# Patient Record
Sex: Male | Born: 1945 | Race: White | Hispanic: No | Marital: Married | Smoking: Never smoker
Health system: Southern US, Community
[De-identification: ages and names within clinical notes are randomized; demographics above are authoritative.]

## PROBLEM LIST (undated history)

## (undated) DIAGNOSIS — E7800 Pure hypercholesterolemia, unspecified: Secondary | ICD-10-CM

## (undated) DIAGNOSIS — F4310 Post-traumatic stress disorder, unspecified: Secondary | ICD-10-CM

## (undated) DIAGNOSIS — I10 Essential (primary) hypertension: Secondary | ICD-10-CM

## (undated) HISTORY — PX: EYE SURGERY: SHX253

## (undated) HISTORY — PX: CARDIAC CATHETERIZATION: SHX172

---

## 2004-07-21 ENCOUNTER — Emergency Department (HOSPITAL_COMMUNITY): Admission: EM | Admit: 2004-07-21 | Discharge: 2004-07-21 | Payer: Self-pay | Admitting: Emergency Medicine

## 2008-03-23 ENCOUNTER — Emergency Department (HOSPITAL_COMMUNITY): Admission: EM | Admit: 2008-03-23 | Discharge: 2008-03-23 | Payer: Self-pay | Admitting: Emergency Medicine

## 2011-12-12 ENCOUNTER — Encounter (HOSPITAL_COMMUNITY): Payer: Self-pay | Admitting: *Deleted

## 2011-12-12 ENCOUNTER — Emergency Department (HOSPITAL_COMMUNITY)
Admission: EM | Admit: 2011-12-12 | Discharge: 2011-12-12 | Disposition: A | Discharge status: Home / Self Care | Payer: Non-veteran care | Attending: Emergency Medicine | Admitting: Emergency Medicine

## 2011-12-12 ENCOUNTER — Emergency Department (HOSPITAL_COMMUNITY): Payer: Non-veteran care

## 2011-12-12 DIAGNOSIS — S61219A Laceration without foreign body of unspecified finger without damage to nail, initial encounter: Secondary | ICD-10-CM

## 2011-12-12 DIAGNOSIS — E7800 Pure hypercholesterolemia, unspecified: Secondary | ICD-10-CM | POA: Insufficient documentation

## 2011-12-12 DIAGNOSIS — W3189XA Contact with other specified machinery, initial encounter: Secondary | ICD-10-CM | POA: Insufficient documentation

## 2011-12-12 DIAGNOSIS — I10 Essential (primary) hypertension: Secondary | ICD-10-CM | POA: Insufficient documentation

## 2011-12-12 DIAGNOSIS — S61209A Unspecified open wound of unspecified finger without damage to nail, initial encounter: Secondary | ICD-10-CM | POA: Insufficient documentation

## 2011-12-12 HISTORY — DX: Essential (primary) hypertension: I10

## 2011-12-12 HISTORY — DX: Pure hypercholesterolemia, unspecified: E78.00

## 2011-12-12 MED ORDER — BUPIVACAINE HCL (PF) 0.25 % IJ SOLN
INTRAMUSCULAR | Status: AC
  Filled 2011-12-12: qty 30

## 2011-12-12 MED ORDER — HYDROCODONE-ACETAMINOPHEN 5-325 MG PO TABS: 1.0000 | ORAL_TABLET | ORAL | Status: AC | PRN

## 2011-12-12 MED ORDER — AMOXICILLIN-POT CLAVULANATE 875-125 MG PO TABS: 1.0000 | ORAL_TABLET | Freq: Two times a day (BID) | ORAL | Status: AC

## 2011-12-12 MED ORDER — LIDOCAINE HCL (PF) 1 % IJ SOLN
INTRAMUSCULAR | Status: AC
  Filled 2011-12-12: qty 5

## 2011-12-12 MED ORDER — HYDROCODONE-ACETAMINOPHEN 5-325 MG PO TABS
2.0000 | ORAL_TABLET | Freq: Once | ORAL | Status: AC
  Administered 2011-12-12: 2 via ORAL
  Filled 2011-12-12: qty 2

## 2011-12-12 NOTE — ED Notes (Signed)
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  Injured on a grinder

## 2011-12-12 NOTE — Discharge Instructions (Signed)
The cut to your finger involves both ligament and tendon. You will need surgical repair to regain full use of the finger. Contact either of the hand surgeon's listed on your paperwork for an appointment. They are both in [place]. Take all of your antibiotics. Use the pain medicine as needed.The stitches in your finger will need to be removed in 7-10 days. You may return to the ER for removal.

## 2011-12-12 NOTE — ED Notes (Signed)
Al Decant, PA at bedside to suture laceration. Suture cart and suture kit at bedside.

## 2011-12-12 NOTE — ED Notes (Signed)
Patient out of room and waved to nurses stating "I'm leaving, I have been here an hour and no one has done a thing for me." X ray tech at bedside to take patient to imaging. RN explained to patient that he was waiting for an x ray and the MD to see him. Patient voluntarily went to x ray.

## 2011-12-12 NOTE — ED Provider Notes (Addendum)
History  .Scribed for [employer]. Colon Branch, MD, the patient was seen in [facility]. The chart was scribed by Gilman Schmidt. The patients care was started at 3:25 PM.    CSN: [id number]  Arrival date & time 12/12/11  1406   First MD Initiated Contact with Patient 12/12/11 1507      Chief Complaint  Patient presents with  . Laceration    (Consider location/radiation/quality/duration/timing/severity/associated sxs/prior treatment) HPI Adam Gay is a 66 y.o. male with a history of HTN and Hypercholesteremia who presents to the Emergency Department complaining of laceration to fifth finger on left hand. Pt reports he was using a hand grounder without a glove. Last Tetanus shot was five months prior. There are no other associated symptoms and no other alleviating or aggravating factors.       Past Medical History  Diagnosis Date  . Hypertension   . Hypercholesteremia     Past Surgical History  Procedure Date  . Eye surgery     No family history on file.  History  Substance Use Topics  . Smoking status: Never Smoker   . Smokeless tobacco: Not on file  . Alcohol Use: Yes      Review of Systems  Skin: Positive for wound.  All other systems reviewed and are negative.    Allergies  Benadryl  Home Medications  No current outpatient prescriptions on file.  BP 138/83  Pulse 82  Temp(Src) 98.2 F (36.8 C) (Oral)  Resp 20  Ht 6\' 2"  (1.88 m)  Wt 255 lb (115.667 kg)  BMI 32.74 kg/m2  Physical Exam  Constitutional: He is oriented to person, place, and time. He appears well-developed and well-nourished.  Non-toxic appearance. He does not have a sickly appearance.  HENT:  Head: Normocephalic and atraumatic.  Eyes: Conjunctivae, EOM and lids are normal. Pupils are equal, round, and reactive to light.  Neck: Trachea normal, normal range of motion and full passive range of motion without pain. Neck supple.  Cardiovascular: Regular rhythm and normal heart sounds.     Pulmonary/Chest: Effort normal and breath sounds normal. No respiratory distress.  Abdominal: Soft. Normal appearance. He exhibits no distension. There is no tenderness. There is no rebound and no CVA tenderness.  Musculoskeletal: He exhibits tenderness.       Left hand: He exhibits tenderness and laceration.       Hands:      Pt has no extension capability in the distal phaylynx.  He also cut the medial collateral ligament and there is joint laxity and instability.  Neurological: He is alert and oriented to person, place, and time. He has normal strength.  Skin: Skin is warm, dry and intact. No rash noted.          Complete circumferetonal lac at distal joint space on fifth finger    ED Course  LACERATION REPAIR Date/Time: 12/12/2011 4:00 PM Performed by: Worthy Rancher Authorized by: Worthy Rancher Consent: Verbal consent obtained. Written consent not obtained. Risks and benefits: risks, benefits and alternatives were discussed Consent given by: patient Patient understanding: patient states understanding of the procedure being performed Site marked: the operative site was not marked Imaging studies: imaging studies available Required items: required blood products, implants, devices, and special equipment available Patient identity confirmed: verbally with patient Time out: Immediately prior to procedure a "time out" was called to verify the correct patient, procedure, equipment, support staff and site/side marked as required. Laceration length: 3 cm Foreign bodies: no foreign bodies  Tendon involvement: complex (extensor function non-functional.  also, medial collateral ligament appears to have been lacerated.  there is laxity and instability to lateral movement.) Nerve involvement: none Vascular damage: no Local anesthetic: bupivacaine 0.5% without epinephrine Anesthetic total: 8 ml Patient sedated: no Preparation: Patient was prepped and draped in the usual sterile  fashion. Irrigation solution: saline Irrigation method: syringe Amount of cleaning: extensive Debridement: none Degree of undermining: none Skin closure: 4-0 nylon Number of sutures: 8 Approximation: close Approximation difficulty: simple Dressing: antibiotic ointment Patient tolerance: Patient tolerated the procedure well with no immediate complications.   (including critical care time) .   Labs Reviewed - No data to display Dg Finger Little Left  12/12/2011  *RADIOLOGY REPORT*  Clinical Data: Laceration.  LEFT LITTLE FINGER 2+V  Comparison: None.  Findings: Soft tissue injury left fifth finger distal interphalangeal joint space.  There may be a tiny fracture at this level.  Soft tissue (tendon and / capsule) injury may be present as the left fifth finger is held in flexion at the distal interphalangeal joint space level.  Tiny radiopaque structure adjacent to the left fifth proximal phalanx distal aspect.  IMPRESSION: Soft tissue injury left fifth finger distal interphalangeal joint space.  There may be a tiny fracture at this level.  Soft tissue (tendon and / capsule) injury may be present as the left fifth finger is held in flexion at the distal interphalangeal joint space level.  Tiny radiopaque structure adjacent to the left fifth proximal phalanx distal aspect.  Original Report Authenticated By: Fuller Canada, M.D.     No diagnosis found.  DIAGNOSTIC STUDIES: Radiology: DG Finger Little Left. Reviewed by me. IMPRESSION: Soft tissue injury left fifth finger distal interphalangeal joint space. There may be a tiny fracture at this level. Soft tissue (tendon and / capsule) injury may be present as the left fifth finger is held in flexion at the distal interphalangeal joint space level. Tiny radiopaque structure adjacent to the left fifth proximal phalanx distal aspect. Original Report Authenticated By: Fuller Canada, M.D.   COORDINATION OF CARE: 3:25pm:  - Patient  evaluated by ED physician, DG Finger Left, Marcaine,and  Xylocaine ordered. Digital Block performed.       MDM   Patient with injury to 5th finger on left hand with a grinder. Laceration severed extensor tendon and medial ligament. Laceration was repaired by mid level under my supervision. Finger placed in a splint. Referrals to hand surgeon given to the patient. Rx for antibiotics and analgesics given.Pt stable in ED with no significant deterioration in condition.The patient appears reasonably screened and/or stabilized for discharge and I doubt any other medical condition or other [facility] requiring further screening, evaluation, or treatment in the ED at this time prior to discharge.  I personally performed the services described in this documentation, which was scribed in my presence. The recorded information has been reviewed and considered.  MDM Reviewed: nursing note and vitals Interpretation: x-ray        Nicoletta Dress. Colon Branch, MD 12/12/11 1703  Nicoletta Dress. Colon Branch, MD 12/12/11 1705

## 2020-03-13 ENCOUNTER — Encounter (HOSPITAL_COMMUNITY): Payer: Self-pay

## 2020-03-13 ENCOUNTER — Emergency Department (HOSPITAL_COMMUNITY)
Admission: EM | Admit: 2020-03-13 | Discharge: 2020-03-13 | Disposition: A | Discharge status: Home / Self Care | Payer: No Typology Code available for payment source | Attending: Emergency Medicine | Admitting: Emergency Medicine

## 2020-03-13 ENCOUNTER — Emergency Department (HOSPITAL_COMMUNITY): Payer: No Typology Code available for payment source

## 2020-03-13 ENCOUNTER — Other Ambulatory Visit: Payer: Self-pay

## 2020-03-13 DIAGNOSIS — M7662 Achilles tendinitis, left leg: Secondary | ICD-10-CM

## 2020-03-13 DIAGNOSIS — M7582 Other shoulder lesions, left shoulder: Secondary | ICD-10-CM | POA: Diagnosis not present

## 2020-03-13 DIAGNOSIS — M25512 Pain in left shoulder: Secondary | ICD-10-CM | POA: Diagnosis present

## 2020-03-13 DIAGNOSIS — I10 Essential (primary) hypertension: Secondary | ICD-10-CM | POA: Diagnosis not present

## 2020-03-13 MED ORDER — PRASUGREL HCL 10 MG PO TABS: 10.0000 mg | ORAL_TABLET | Freq: Once | ORAL | Status: DC

## 2020-03-13 MED ORDER — PREDNISONE 10 MG PO TABS: ORAL_TABLET | ORAL | 0 refills | Status: AC

## 2020-03-13 MED ORDER — HYDROCODONE-ACETAMINOPHEN 5-325 MG PO TABS: 1.0000 | ORAL_TABLET | ORAL | 0 refills | Status: AC | PRN

## 2020-03-13 NOTE — ED Notes (Signed)
ED Provider at bedside. 

## 2020-03-13 NOTE — Discharge Instructions (Signed)
Return if any problems.

## 2020-03-13 NOTE — ED Provider Notes (Signed)
  Face-to-face evaluation   History: He presents for evaluation of intermittent pain left shoulder, worsening.  Also has soreness of left ankle.  Physical exam: Alert, elderly male, who is calm and comfortable.  Left shoulder has good range of motion but gives way on testing of strength the rotator cuff.  Left calcaneus is swollen and tender posteriorly, and appears to have a bursitis.  Medical screening examination/treatment/procedure(s) were conducted as a shared visit with non-physician practitioner(s) and myself.  I personally evaluated the patient during the encounter    Mancel Bale, MD 03/14/20 2318

## 2020-03-13 NOTE — ED Provider Notes (Signed)
[facility] EMERGENCY DEPARTMENT Provider Note   CSN: [id number] Arrival date & time: 03/13/20  1029     History Chief Complaint  Patient presents with  . Shoulder Pain    Adam Gay is a 74 y.o. male.  The history is provided by the patient. No language interpreter was used.  Shoulder Pain Location:  Shoulder Shoulder location:  L shoulder Injury: no   Pain details:    Quality:  Aching   Radiates to:  L shoulder   Severity:  Moderate   Onset quality:  Gradual   Timing:  Constant   Progression:  Worsening Dislocation: no   Prior injury to area:  No Relieved by:  Nothing Worsened by:  Nothing Ineffective treatments:  None tried Associated symptoms: no swelling   Risk factors: no concern for non-accidental trauma   Pt reports pain with using left shoulder.  Pain with trying to lift .  Pt also has a knot to the back of his left ankle that is painful      Past Medical History:  Diagnosis Date  . Hypercholesteremia   . Hypertension     There are no problems to display for this patient.   Past Surgical History:  Procedure Laterality Date  . CARDIAC CATHETERIZATION    . EYE SURGERY         No family history on file.  Social History   Tobacco Use  . Smoking status: Never Smoker  . Smokeless tobacco: Never Used  Substance Use Topics  . Alcohol use: Yes    Comment: occ  . Drug use: No    Home Medications Prior to Admission medications   Medication Sig Start Date End Date Taking? Authorizing Provider  HYDROcodone-acetaminophen (NORCO/VICODIN) 5-325 MG tablet Take 1 tablet by mouth every 4 (four) hours as needed. 03/13/20   Elson Areas, PA-C  predniSONE (DELTASONE) 10 MG tablet 6,5,4,3,2,1 taper 03/13/20   Elson Areas, PA-C    Allergies    Benadryl [diphenhydramine hcl]  Review of Systems   Review of Systems  Musculoskeletal: Positive for arthralgias, joint swelling and myalgias.  All other systems reviewed and are negative.    Physical Exam Updated Vital Signs BP 119/72 (BP Location: Right Arm)   Pulse 64   Temp 98.4 F (36.9 C) (Oral)   Resp 16   Ht 6\' 2"  (1.88 m)   Wt 109.3 kg   SpO2 98%   BMI 30.94 kg/m   Physical Exam Vitals and nursing note reviewed.  Constitutional:      Appearance: He is well-developed.  HENT:     Head: Normocephalic and atraumatic.  Eyes:     Conjunctiva/sclera: Conjunctivae normal.  Cardiovascular:     Rate and Rhythm: Normal rate and regular rhythm.     Pulses: Normal pulses.     Heart sounds: No murmur.  Pulmonary:     Effort: Pulmonary effort is normal. No respiratory distress.     Breath sounds: Normal breath sounds.  Abdominal:     Palpations: Abdomen is soft.     Tenderness: There is no abdominal tenderness.  Musculoskeletal:        General: Swelling and tenderness present.     Cervical back: Neck supple.     Comments: Tender left shoulder, decreased range of motion com paired with right.  Left heel swollen area, looks like an inflamed bursa.   Skin:    General: Skin is warm and dry.  Neurological:     General:  No focal deficit present.     Mental Status: He is alert.  Psychiatric:        Mood and Affect: Mood normal.     ED Results / Procedures / Treatments   Labs (all labs ordered are listed, but only abnormal results are displayed) Labs Reviewed - No data to display  EKG None  Radiology DG Shoulder Left  Result Date: 03/13/2020 CLINICAL DATA:  Pt reports an old shoulder injury to left shoulder. Pt says pain flares up from time to time. Reports shoulder started hurting again 2 weeks ago or longer. Pt says hurts to move his left arm. ALso reports has a heel spur on left foot. EXAM: LEFT SHOULDER - 2+ VIEW COMPARISON:  None. FINDINGS: No fracture or bone lesion. The glenohumeral joint is normally spaced and aligned. Mild AC joint space narrowing with marginal osteophytes consistent with mild osteoarthritis. Subtle calcification projects above the  superolateral humeral head consistent with rotator cuff calcific tendinitis. IMPRESSION: 1. No fracture or acute finding. 2. Evidence of rotator cuff calcific tendinitis. 3. Mild AC joint osteoarthritis. Electronically Signed   By: Lajean Manes M.D.   On: 03/13/2020 11:58   DG Foot Complete Left  Result Date: 03/13/2020 CLINICAL DATA:  Pt reports an old shoulder injury to left shoulder. Pt says pain flares up from time to time. Reports shoulder started hurting again 2 weeks ago or longer. Pt says hurts to move his left arm. ALso reports has a heel spur on left foot. EXAM: LEFT FOOT - COMPLETE 3+ VIEW COMPARISON:  None. FINDINGS: No fracture or bone lesion. Minor asymmetric joint space narrowing of the first metatarsophalangeal joint with small marginal spurs consistent osteoarthritis. Remaining joints are normally spaced and aligned. Large dorsal calcaneal spur with associated soft tissue prominence. Moderate-sized plantar calcaneal spur. Soft tissues otherwise unremarkable. IMPRESSION: 1. No fracture or acute finding. 2. Large dorsal calcaneal spur. Associated soft tissue prominence. Consider Achilles tendinosis in the proper clinical setting which could be best assessed with ankle MRI. 3. Moderate plantar calcaneal spur. Mild first metatarsophalangeal joint osteoarthritis. Electronically Signed   By: Lajean Manes M.D.   On: 03/13/2020 12:00    Procedures Procedures (including critical care time)  Medications Ordered in ED Medications - No data to display  ED Course  I have reviewed the triage vital signs and the nursing notes.  Pertinent labs & imaging results that were available during my care of the patient were reviewed by me and considered in my medical decision making (see chart for details).    MDM Rules/Calculators/A&P                      MDM: Hoover Brunette shows tendon calcification.  Pt given number for Dr. Aline Brochure for follow up.  Rx for prednisone and hydrocodone  Final Clinical  Impression(s) / ED Diagnoses Final diagnoses:  Rotator cuff tendonitis, left  Tendonitis, Achilles, left    Rx / DC Orders ED Discharge Orders         Ordered    predniSONE (DELTASONE) 10 MG tablet     03/13/20 1247    HYDROcodone-acetaminophen (NORCO/VICODIN) 5-325 MG tablet  Every 4 hours PRN     03/13/20 1247        An After Visit Summary was printed and given to the patient.    Fransico Meadow, [place] 03/13/20 1425    Daleen Bo, MD 03/14/20 2317

## 2020-03-13 NOTE — ED Triage Notes (Signed)
Pt reports an old shoulder injury to left shoulder.  Pt says pain flares up from time to time.  Reports shoulder started hurting again 2 weeks ago or longer.  Pt says hurts to move his left arm.  ALso reports has a heel spur on left foot.

## 2021-11-29 IMAGING — DX DG FOOT COMPLETE 3+V*L*
3 series · 3 of 3 positions shown · non-contrast
Comparison: None.

CLINICAL DATA: Pt reports an old shoulder injury to left shoulder.
Pt says pain flares up from time to time. Reports shoulder started
hurting again 2 weeks ago or longer. Pt says hurts to move his left
arm. ALso reports has a heel spur on left foot.

EXAM:
LEFT FOOT - COMPLETE 3+ VIEW

[foot ap]
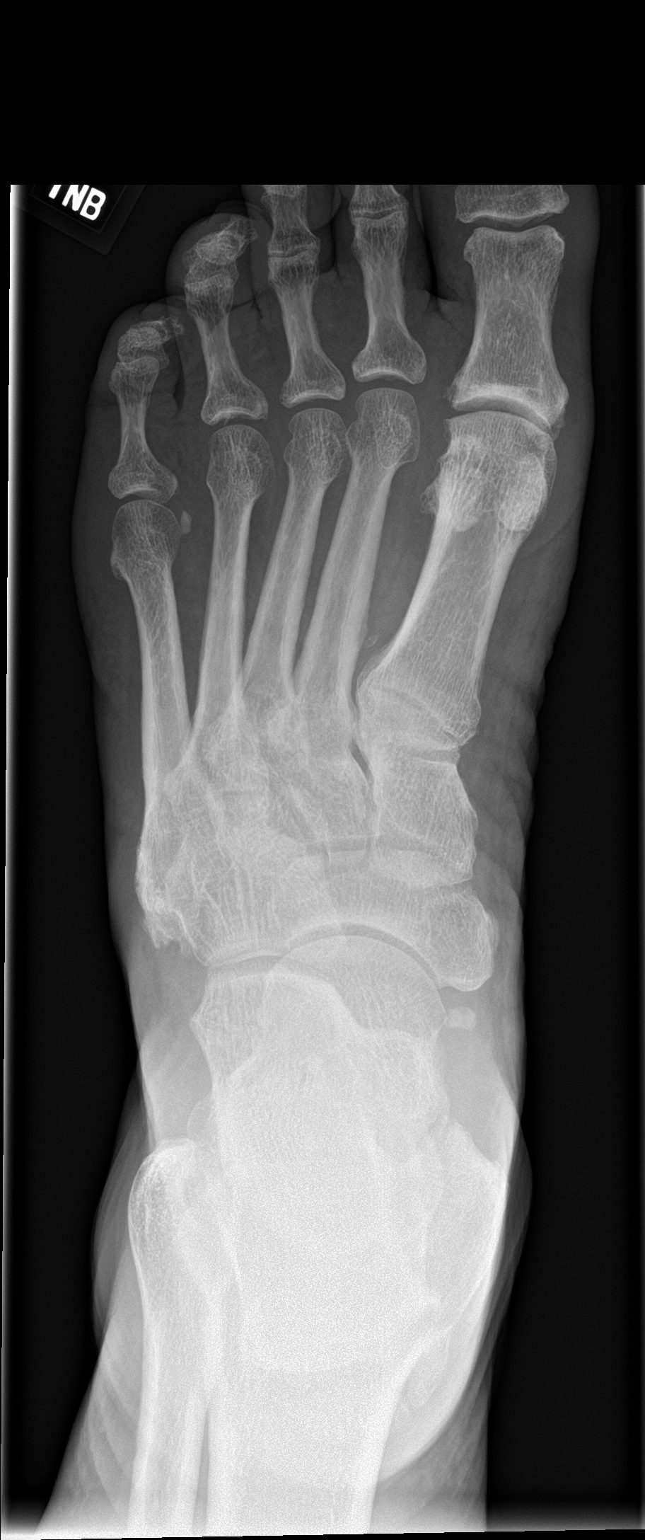

[foot obl]
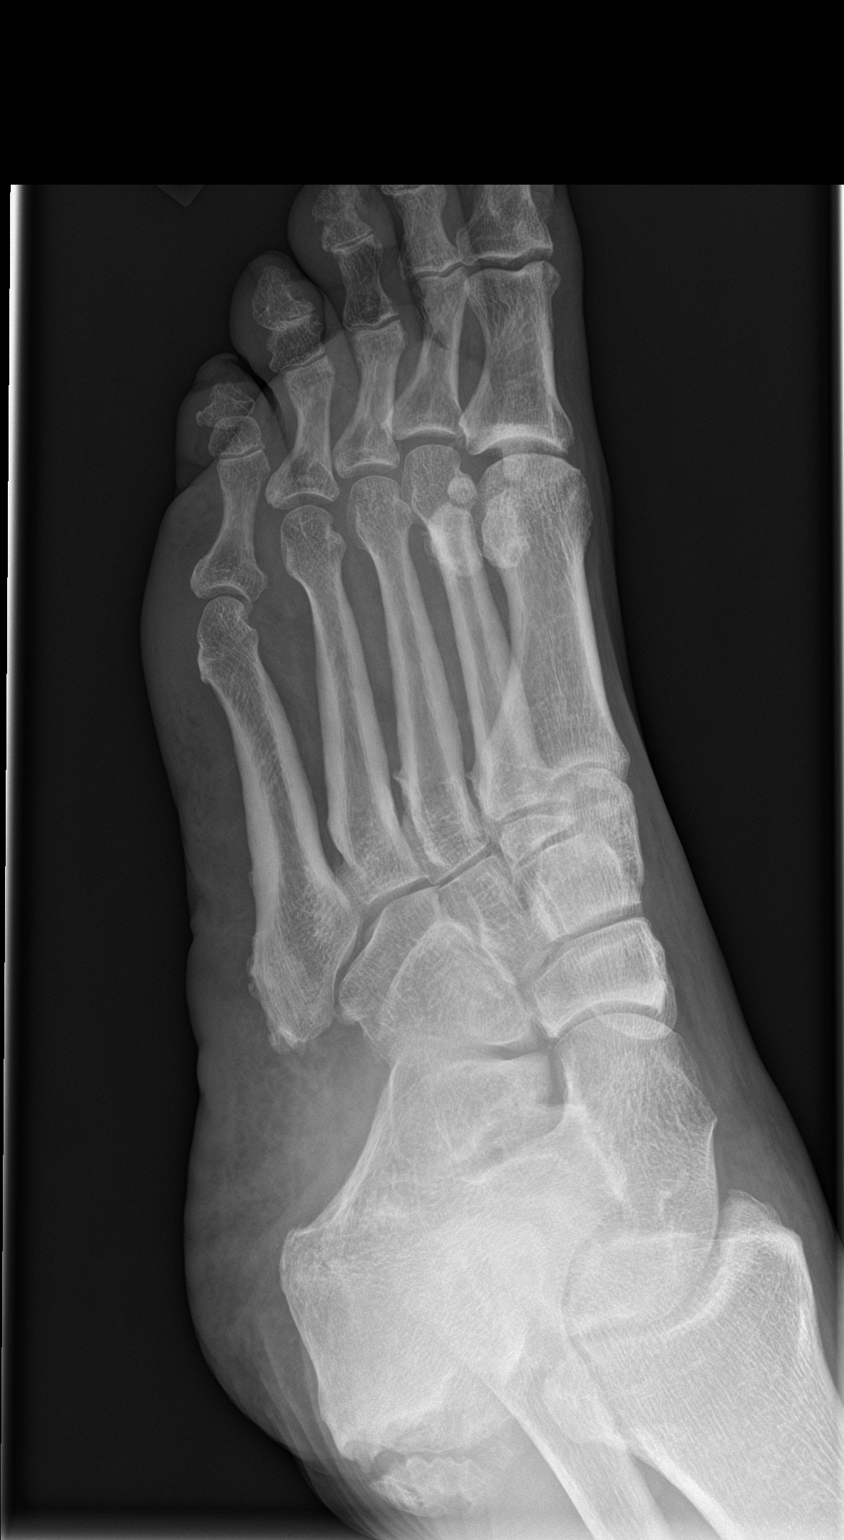

[foot lat]
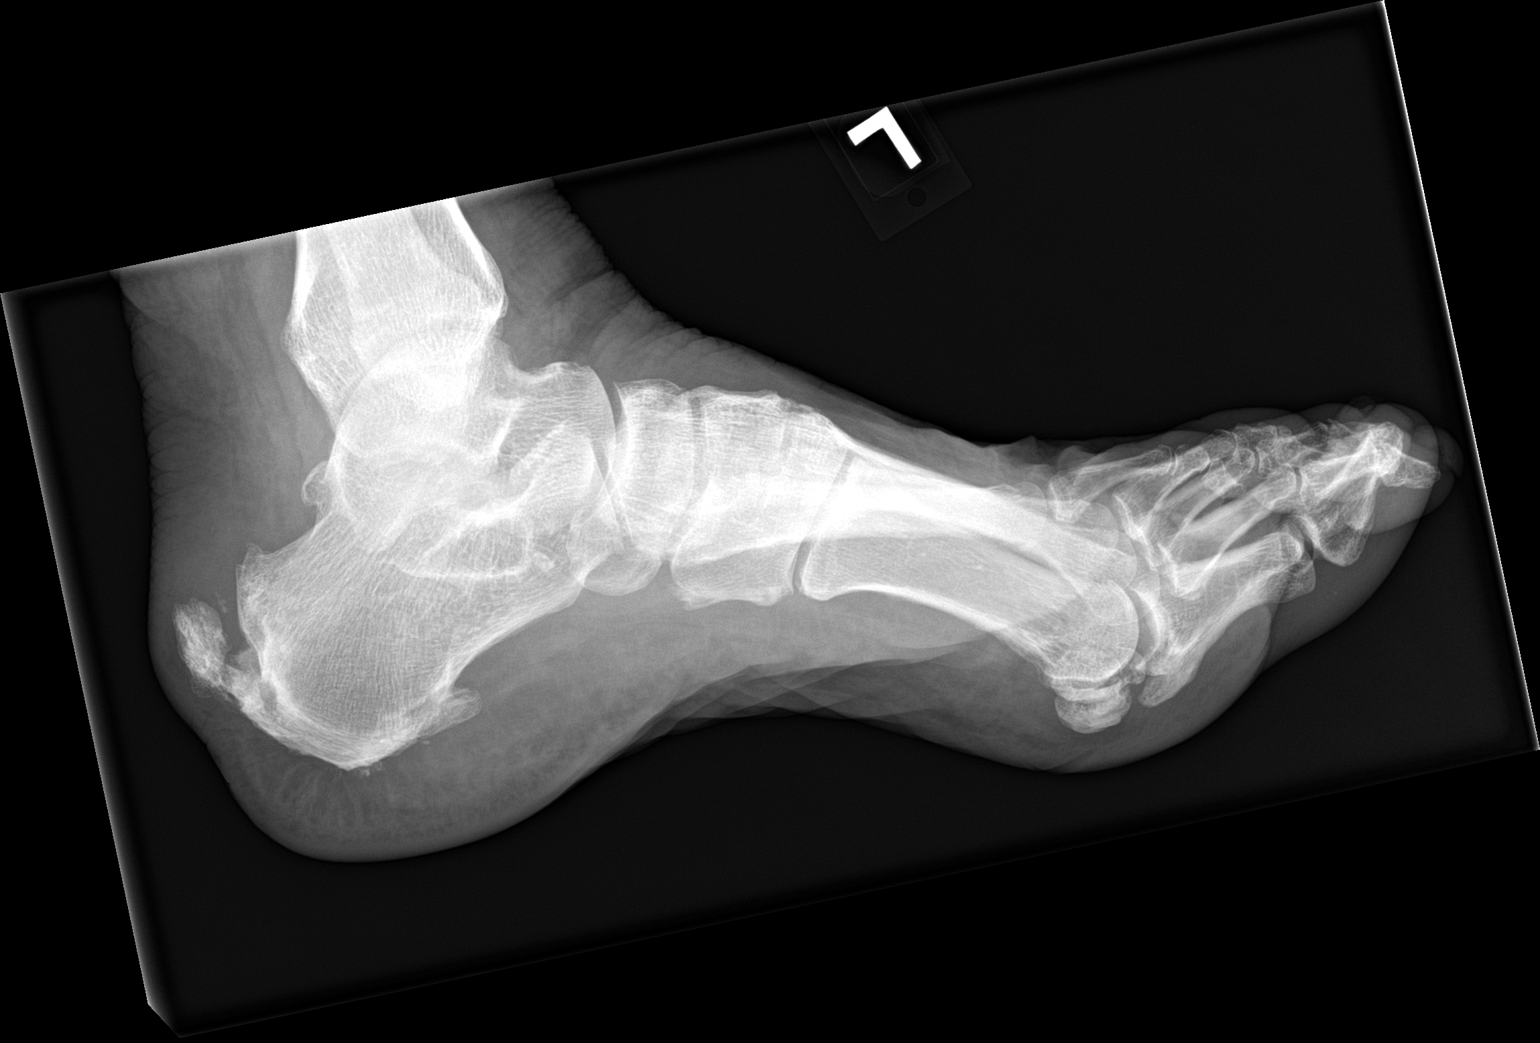

[3 of 3 positions shown; findings below may reference images not displayed]

FINDINGS: No fracture or bone lesion.

Minor asymmetric joint space narrowing of the first
metatarsophalangeal joint with small marginal spurs consistent
osteoarthritis. Remaining joints are normally spaced and aligned.

Large dorsal calcaneal spur with associated soft tissue prominence.
Moderate-sized plantar calcaneal spur.

Soft tissues otherwise unremarkable.
IMPRESSION: 1. No fracture or acute finding.
2. Large dorsal calcaneal spur. Associated soft tissue prominence.
Consider Achilles tendinosis in the proper clinical setting which
could be best assessed with ankle MRI.
3. Moderate plantar calcaneal spur. Mild first metatarsophalangeal
joint osteoarthritis.

## 2021-11-29 IMAGING — DX DG SHOULDER 2+V*L*
3 series · 3 of 3 positions shown · non-contrast
Comparison: None.

CLINICAL DATA: Pt reports an old shoulder injury to left shoulder.
Pt says pain flares up from time to time. Reports shoulder started
hurting again 2 weeks ago or longer. Pt says hurts to move his left
arm. ALso reports has a heel spur on left foot.

EXAM:
LEFT SHOULDER - 2+ VIEW

[shoulder grashey]
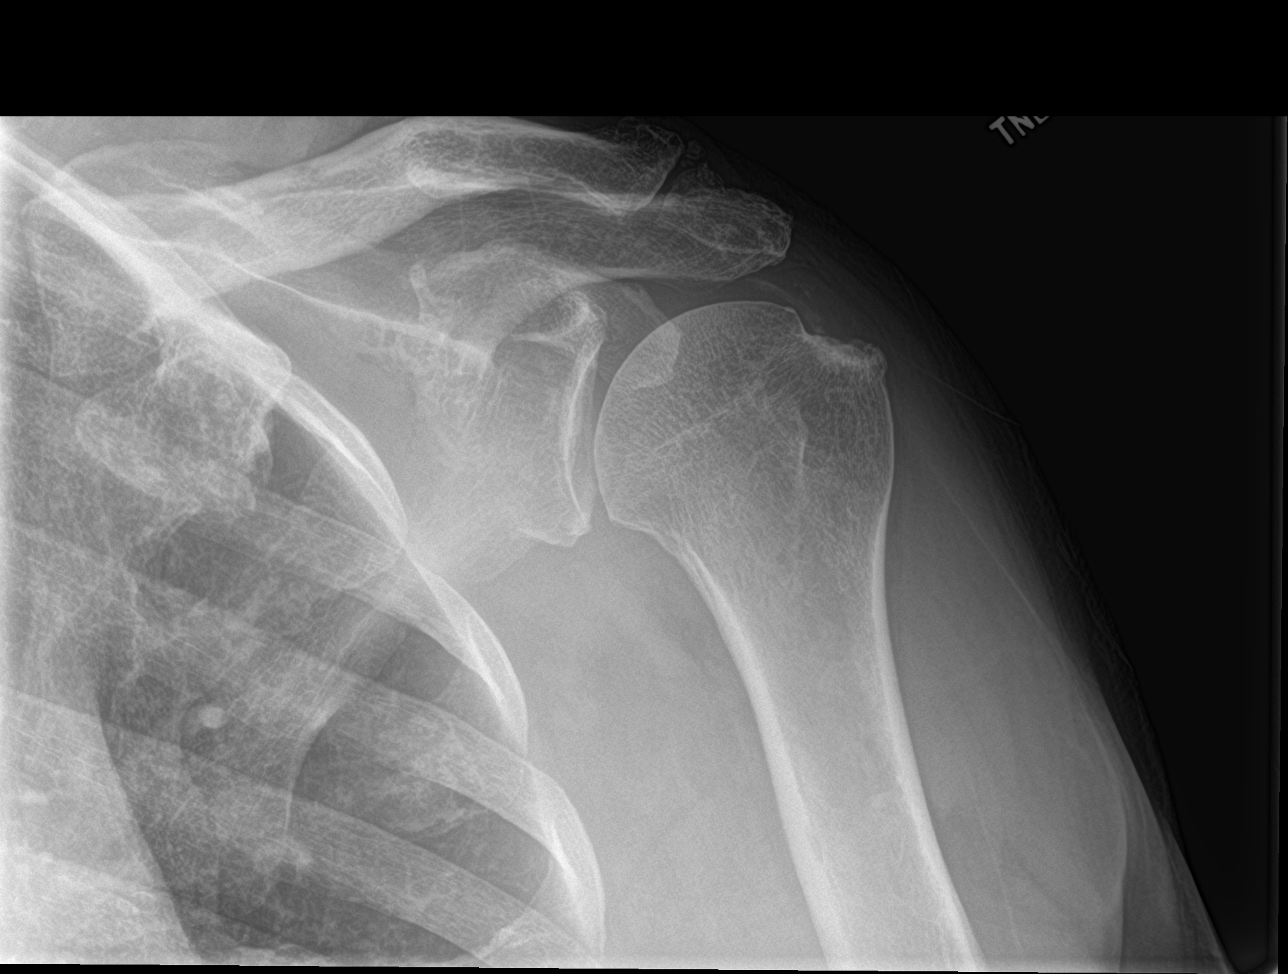

[shoulder y view]
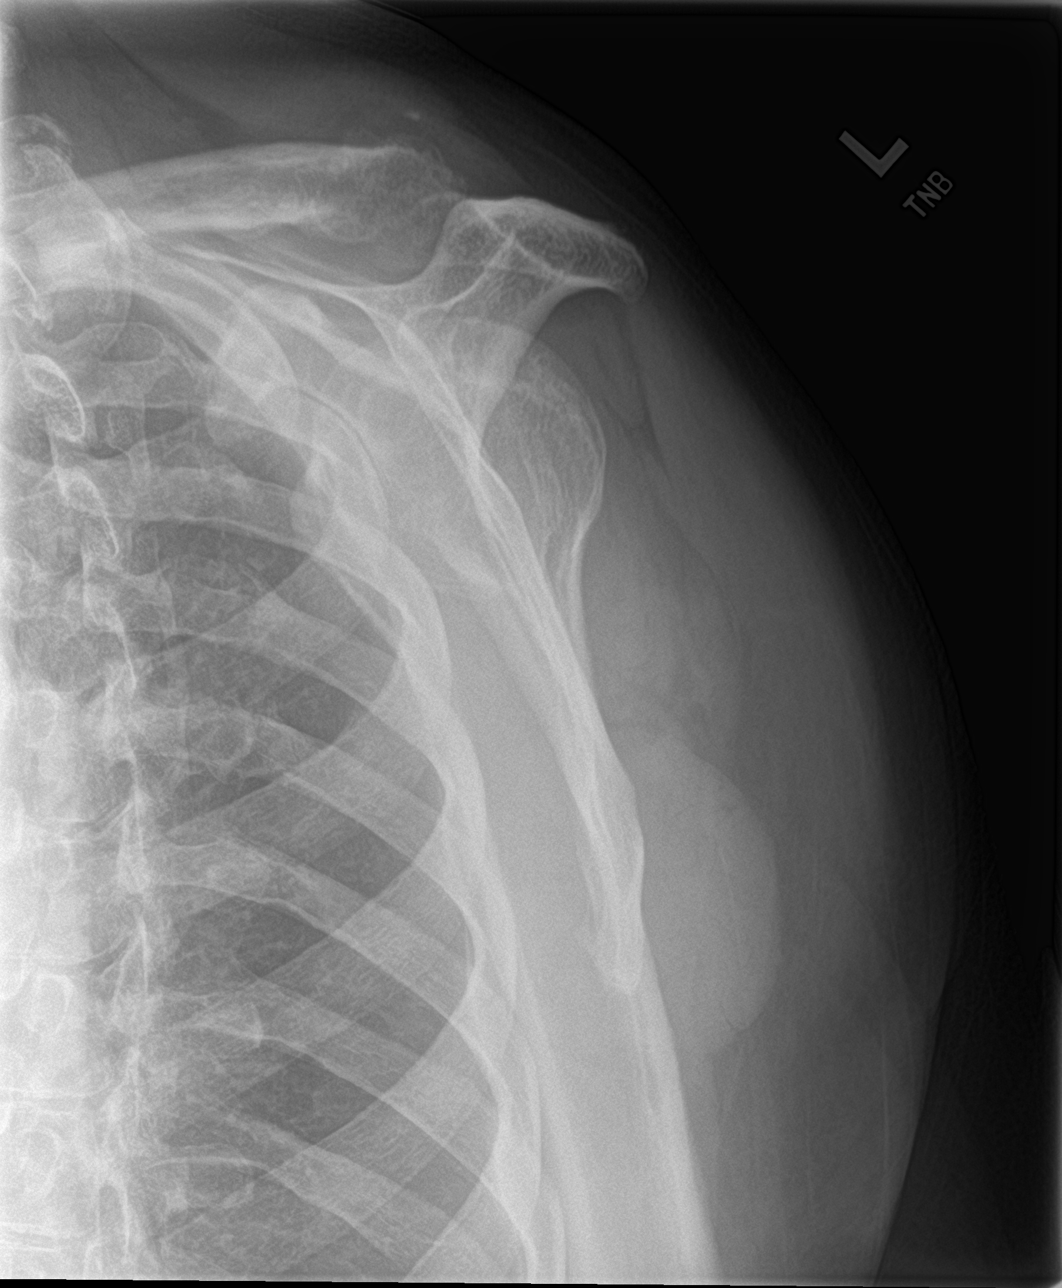

[shoulder axillary]
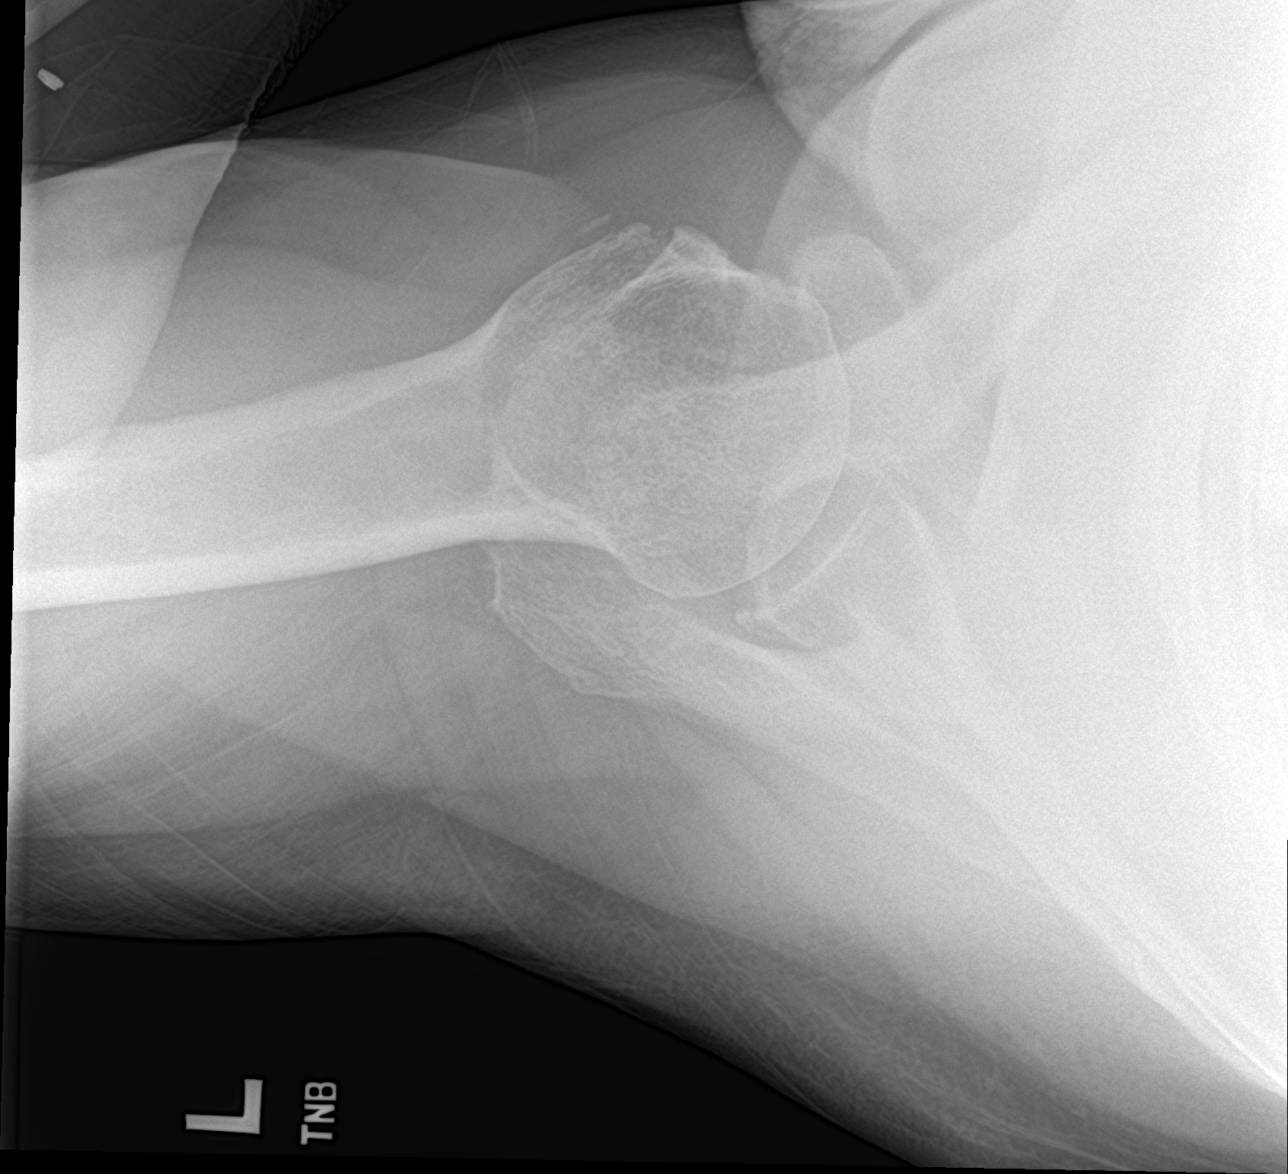

[3 of 3 positions shown; findings below may reference images not displayed]

FINDINGS: No fracture or bone lesion.

The glenohumeral joint is normally spaced and aligned.

Mild AC joint space narrowing with marginal osteophytes consistent
with mild osteoarthritis.

Subtle calcification projects above the superolateral humeral head
consistent with rotator cuff calcific tendinitis.
IMPRESSION: 1. No fracture or acute finding.
2. Evidence of rotator cuff calcific tendinitis.
3. Mild AC joint osteoarthritis.

## 2022-10-17 ENCOUNTER — Encounter (HOSPITAL_COMMUNITY): Payer: Self-pay | Admitting: Emergency Medicine

## 2022-10-17 ENCOUNTER — Emergency Department (HOSPITAL_COMMUNITY): Payer: No Typology Code available for payment source

## 2022-10-17 ENCOUNTER — Other Ambulatory Visit: Payer: Self-pay

## 2022-10-17 ENCOUNTER — Emergency Department (HOSPITAL_COMMUNITY)
Admission: EM | Admit: 2022-10-17 | Discharge: 2022-10-17 | Disposition: A | Discharge status: Home / Self Care | Payer: No Typology Code available for payment source | Attending: Emergency Medicine | Admitting: Emergency Medicine

## 2022-10-17 DIAGNOSIS — S01111A Laceration without foreign body of right eyelid and periocular area, initial encounter: Secondary | ICD-10-CM | POA: Diagnosis not present

## 2022-10-17 DIAGNOSIS — W01198A Fall on same level from slipping, tripping and stumbling with subsequent striking against other object, initial encounter: Secondary | ICD-10-CM | POA: Insufficient documentation

## 2022-10-17 DIAGNOSIS — I10 Essential (primary) hypertension: Secondary | ICD-10-CM | POA: Diagnosis not present

## 2022-10-17 DIAGNOSIS — H538 Other visual disturbances: Secondary | ICD-10-CM | POA: Diagnosis present

## 2022-10-17 HISTORY — DX: Post-traumatic stress disorder, unspecified: F43.10

## 2022-10-17 NOTE — ED Provider Notes (Signed)
Care patient received from prior provider at 0 700.  Please see their note for initial H&P. I received a call from ophthalmology that they were going to see him in the oculoplastics clinic.  I confirmed with patient patient's significant other at bedside who will drive him directly to the clinic at this time.  He stated that his tetanus was updated a few weeks ago for another fall. Patient stable for discharge from emergency room with plan to proceed via POV directly to ophthalmology clinic per Dr. Randon Goldsmith request.   Glyn Ade, MD 10/17/22 1455

## 2022-10-17 NOTE — ED Notes (Signed)
Pts right eye lid is split in the center. No active bleeding noted

## 2022-10-17 NOTE — Discharge Instructions (Addendum)
[facility] [address] [place], [place], [place] [address]  You were seen today for an eyelid laceration.  Please proceed directly to the attached location.  Dr. Randon Goldsmith recommend you proceed directly.

## 2022-10-17 NOTE — ED Triage Notes (Signed)
Pt was walking in the house towards the TV when tripped and his Right Eye hit the corner of the tv. Blood and injury noted to right eye. Vision is blurry in that eye. Pt denies being on a blood thinner. Denies headache

## 2022-10-17 NOTE — ED Notes (Signed)
ED Provider at bedside. 

## 2022-10-17 NOTE — ED Provider Notes (Signed)
[facility] EMERGENCY DEPARTMENT Provider Note   CSN: [id number] Arrival date & time: 10/17/22  7341     History  Chief Complaint  Patient presents with   Fall with Head Injury    Adam Gay is a 76 y.o. male.  The history is provided by the patient.  He has history of hypertension, hyperlipidemia and comes in after falling and striking a TV with his right eye.  He is complaining of blurred vision in that eye.  He denies loss of consciousness and denies other injury.  Last tetanus immunization was 3 months ago.   Home Medications Prior to Admission medications   Medication Sig Start Date End Date Taking? Authorizing Provider  allopurinol (ZYLOPRIM) 100 MG tablet Take 2 tablets by mouth daily. 05/13/22  Yes [provider]  ammonium lactate (LAC-HYDRIN) 12 % lotion APPLY MODERATE AMOUNT TO AFFECTED AREA DAILY FOR THE SKIN 05/02/22  Yes [provider]  Capsaicin 0.1 % CREA APPLY SMALL AMOUNT TO AFFECTED AREA TWICE A DAY AS NEEDED FOR NEUROPATHY 05/07/22  Yes [provider]  FLUoxetine (PROZAC) 20 MG capsule TAKE THREE CAPSULES BY MOUTH DAILY FOR MENTAL HEALTH 12/21/21  Yes [provider]  gabapentin (NEURONTIN) 300 MG capsule TAKE THREE CAPSULES BY MOUTH THREE TIMES A DAY 05/27/22  Yes [provider]  metFORMIN (GLUCOPHAGE) 500 MG tablet TAKE ONE TABLET BY MOUTH DAILY FOR DIABETES (ANNUAL KIDNEY FUNCTION TESTING IS NEEDED) 12/14/21  Yes [provider]  QUEtiapine (SEROQUEL) 25 MG tablet TAKE ONE TABLET BY MOUTH AT NOON FOR MOOD/IRRITABILITY 12/21/21  Yes [provider]  tamsulosin (FLOMAX) 0.4 MG CAPS capsule Take 1 capsule by mouth at bedtime. 12/12/21  Yes [provider]  traZODone (DESYREL) 150 MG tablet Take 1 tablet by mouth at bedtime as needed. 06/07/22  Yes [provider]  atorvastatin (LIPITOR) 10 MG tablet Take by mouth.    [provider]  HYDROcodone-acetaminophen (NORCO/VICODIN)  5-325 MG tablet Take 1 tablet by mouth every 4 (four) hours as needed. 03/13/20   Elson Areas, PA-C  predniSONE (DELTASONE) 10 MG tablet 6,5,4,3,2,1 taper 03/13/20   Elson Areas, PA-C      Allergies    Benadryl [diphenhydramine hcl]    Review of Systems   Review of Systems  All other systems reviewed and are negative.   Physical Exam Updated Vital Signs BP (!) 142/81 (BP Location: Left Arm)   Pulse 93   Temp 99.7 F (37.6 C) (Oral)   Resp 20   Ht 6\' 2"  (1.88 m)   Wt 112.5 kg   SpO2 93%   BMI 31.84 kg/m  Physical Exam Vitals and nursing note reviewed.  76 year old male, resting comfortably and in no acute distress. Vital signs are significant for borderline elevated blood pressure. Oxygen saturation is 93%, which is normal. Head is normocephalic.  There is a full-thickness laceration of the upper lid of the right eye.  Globe appears intact.  There is no hyphema. PERRLA, EOMI. Oropharynx is clear. Neck is nontender and supple without adenopathy or JVD. Back is nontender and there is no CVA tenderness. Lungs are clear without rales, wheezes, or rhonchi. Chest is nontender. Heart has regular rate and rhythm without murmur. Abdomen is soft, flat, nontender. Extremities have no cyanosis or edema, full range of motion is present. Skin is warm and dry without rash. Neurologic: Mental status is normal, cranial nerves are intact, moves all extremities equally.   Media Information  Document  Information  Photos    10/17/2022 06:01  Attached To:  Hospital Encounter on 10/17/22  Source Information  Dione Booze, MD  [facility]Emergency [facility]     Media Information  Document Information  Photos    10/17/2022 06:01  Attached To:  Hospital Encounter on 10/17/22  Source Information  Dione Booze, MD  [facility]Emergency Dept   ED Results / Procedures / Treatments    Radiology No results found.  Procedures Procedures    Medications Ordered in ED Medications - No data  to display  ED Course/ Medical Decision Making/ A&P Clinical Course as of 10/17/22 0732  Thu Oct 17, 2022  0700 Consulted with Dr Randon Goldsmith overnight for complex eyelid. [CC]    Clinical Course User Index [CC] Glyn Ade, MD                           Medical Decision Making Amount and/or Complexity of Data Reviewed Radiology: ordered.   Fall with injury to the upper lid of the right eye.  This is a full-thickness laceration which will require specialty care for repair.  I have ordered CT of the orbit to rule out other associated injury.  Visual acuity is 20/20 bilaterally.  CT scan shows no injury other than the laceration of the eyelid.  I have independently viewed the images, and agree with radiologist's interpretation.  I have discussed the case with Dr. Randon Goldsmith who is on-call for ophthalmology.  He states he is going to discuss the case with a partner of his who is trained in oculoplastics, and he will do the repair.  They will get back to [place] with specifics of where and when to send the patient.  Final Clinical Impression(s) / ED Diagnoses Final diagnoses:  Right eyelid laceration, initial encounter    Rx / DC Orders ED Discharge Orders     None         Dione Booze, MD 10/17/22 [phone]

## 2023-04-07 ENCOUNTER — Encounter (HOSPITAL_BASED_OUTPATIENT_CLINIC_OR_DEPARTMENT_OTHER): Payer: Self-pay

## 2023-04-07 DIAGNOSIS — G4730 Sleep apnea, unspecified: Secondary | ICD-10-CM

## 2023-06-18 ENCOUNTER — Ambulatory Visit: Payer: No Typology Code available for payment source | Attending: Family | Admitting: Pulmonary Disease

## 2023-06-18 DIAGNOSIS — G4736 Sleep related hypoventilation in conditions classified elsewhere: Secondary | ICD-10-CM | POA: Insufficient documentation

## 2023-06-18 DIAGNOSIS — G4761 Periodic limb movement disorder: Secondary | ICD-10-CM | POA: Diagnosis not present

## 2023-06-18 DIAGNOSIS — G4730 Sleep apnea, unspecified: Secondary | ICD-10-CM

## 2023-06-18 DIAGNOSIS — G4733 Obstructive sleep apnea (adult) (pediatric): Secondary | ICD-10-CM | POA: Diagnosis present

## 2023-06-25 ENCOUNTER — Telehealth: Payer: Self-pay | Admitting: Pulmonary Disease

## 2023-06-25 DIAGNOSIS — G4733 Obstructive sleep apnea (adult) (pediatric): Secondary | ICD-10-CM

## 2023-06-25 DIAGNOSIS — G4730 Sleep apnea, unspecified: Secondary | ICD-10-CM

## 2023-06-25 NOTE — Procedures (Deleted)
    NAME: Adam Gay DATE OF BIRTH:  24-Dec-1945 MEDICAL RECORD NUMBER [id number]  LOCATION: [facility]  PHYSICIAN: Comer Locket. Azya Barbero  DATE OF STUDY: 06/18/2023  SLEEP STUDY TYPE: Out of Center Sleep Test                REFERRING PHYSICIAN: Delma Freeze, NP  INDICATION FOR STUDY: ***  EPWORTH SLEEPINESS SCORE:   HEIGHT:    WEIGHT:      There is no height or weight on file to calculate BMI.  NECK SIZE:   in.  MEDICATIONS: ***  IMPRESSION:  ***    RECOMMENDATION:  ***   Adam Gay, Biomedical engineer of Sleep Medicine  ELECTRONICALLY SIGNED ON:  06/25/2023, 6:22 AM [facility] PH: [phone]   FX: [phone] ACCREDITED BY THE AMERICAN ACADEMY OF SLEEP MEDICINE

## 2023-06-25 NOTE — Procedures (Signed)
Patient Name: Adam Gay, Adam Gay Date: 06/18/2023 Gender: Male D.O.B: Jan 10, 1946 Age (years): 77 Referring Provider: Delma Freeze Height (inches): 74 Interpreting Physician: Cyril Mourning MD, ABSM Weight (lbs): 240 RPSGT: Alfonso Ellis BMI: 31 MRN: [id number] Neck Size: 19.00 <br> <br> CLINICAL INFORMATION Sleep Study Type: NPSG    Indication for sleep study: snoring, non refreshing sleep, fatigue    Epworth Sleepiness Score: 5    SLEEP STUDY TECHNIQUE As per the AASM Manual for the Scoring of Sleep and Associated Events v2.3 (April 2016) with a hypopnea requiring 4% desaturations.  The channels recorded and monitored were frontal, central and occipital EEG, electrooculogram (EOG), submentalis EMG (chin), nasal and oral airflow, thoracic and abdominal wall motion, anterior tibialis EMG, snore microphone, electrocardiogram, and pulse oximetry.  MEDICATIONS Medications self-administered by patient taken the night of the study : FLOMAX, GABAPENTIN, MELATONIN, TRAZODONE  SLEEP ARCHITECTURE The study was initiated at 10:02:31 PM and ended at 4:46:19 AM.  Sleep onset time was 109.2 minutes and the sleep efficiency was 63.0%. The total sleep time was 254.6 minutes.  Stage REM latency was 242.5 minutes.  The patient spent 8.84% of the night in stage N1 sleep, 52.47% in stage N2 sleep, 21.61% in stage N3 and 17.1% in REM.  Alpha intrusion was absent.  Supine sleep was 76.06%.  RESPIRATORY PARAMETERS The overall apnea/hypopnea index (AHI) was 46.2 per hour. There were 10 total apneas, including 10 obstructive, 0 central and 0 mixed apneas. There were 186 hypopneas and 0 RERAs.  The AHI during Stage REM sleep was 64.8 per hour.  AHI while supine was 54.5 per hour.  The mean oxygen saturation was 85.81%. The minimum SpO2 during sleep was 74.00%.  loud snoring was noted during this study.  CARDIAC DATA The 2 lead EKG demonstrated sinus rhythm. The mean heart rate was  72.34 beats per minute. Other EKG findings include: PVCs.   LEG MOVEMENT DATA The total PLMS were 711 with a resulting PLMS index of 167.58. Associated arousal with leg movement index was 15.8 .  IMPRESSIONS - Severe obstructive sleep apnea occurred during this study (AHI = 46.2/h). - Moderate oxygen desaturation was noted during this study (Min O2 = 74.00%). - The patient snored with loud snoring volume. - EKG findings include PVCs. - Severe periodic limb movements of sleep occurred during the study. Associated arousals were significant.   DIAGNOSIS - Obstructive Sleep Apnea (G47.33) - Periodic Limb Movement During Sleep (G47.61) - Nocturnal Hypoxemia (G47.36)   RECOMMENDATIONS - Therapeutic CPAP titration to determine optimal pressure required to alleviate sleep disordered breathing. - Positional therapy avoiding supine position during sleep. - Consider treatment of Periodic Leg Movements of Sleep if symptomatic or if arousals persist during titration study. - Avoid alcohol, sedatives and other CNS depressants that may worsen sleep apnea and disrupt normal sleep architecture. - Sleep hygiene should be reviewed to assess factors that may improve sleep quality. - Weight management and regular exercise should be initiated or continued if appropriate.  [Electronically signed] 06/25/2023 06:22 AM  Cyril Mourning MD, ABSM Diplomate, American Board of Sleep Medicine NPI: [id number]

## 2023-06-25 NOTE — Telephone Encounter (Signed)
Spoke with patient regarding results and recommendations. Patient voiced understanding; he will contact [facility] for next steps. No further questions or concerns.

## 2023-06-25 NOTE — Telephone Encounter (Signed)
NPSG  showed severe  OSA with AHI 46/ hr with low sat 74% He should discuss next steps with referring provider

## 2023-11-03 ENCOUNTER — Encounter (HOSPITAL_BASED_OUTPATIENT_CLINIC_OR_DEPARTMENT_OTHER): Payer: Self-pay

## 2023-11-03 DIAGNOSIS — G4733 Obstructive sleep apnea (adult) (pediatric): Secondary | ICD-10-CM

## 2023-12-21 ENCOUNTER — Encounter (HOSPITAL_BASED_OUTPATIENT_CLINIC_OR_DEPARTMENT_OTHER): Payer: Non-veteran care | Admitting: Internal Medicine

## 2024-01-27 DEATH — deceased
